# Patient Record
Sex: Female | Born: 1970 | Race: White
Health system: Southern US, Community
[De-identification: ages and names within clinical notes are randomized; demographics above are authoritative.]

---

## 2004-09-09 ENCOUNTER — Other Ambulatory Visit: Admission: RE | Admit: 2004-09-09 | Discharge: 2004-09-09 | Payer: Self-pay | Admitting: Obstetrics and Gynecology

## 2004-12-12 ENCOUNTER — Emergency Department (HOSPITAL_COMMUNITY): Admission: EM | Admit: 2004-12-12 | Discharge: 2004-12-12 | Payer: Self-pay | Admitting: Emergency Medicine

## 2005-03-31 ENCOUNTER — Inpatient Hospital Stay (HOSPITAL_COMMUNITY): Admission: AD | Admit: 2005-03-31 | Discharge: 2005-04-02 | Payer: Self-pay | Admitting: Obstetrics and Gynecology

## 2006-02-12 ENCOUNTER — Other Ambulatory Visit: Admission: RE | Admit: 2006-02-12 | Discharge: 2006-02-12 | Payer: Self-pay | Admitting: Obstetrics and Gynecology

## 2006-10-12 ENCOUNTER — Ambulatory Visit (HOSPITAL_COMMUNITY): Admission: RE | Admit: 2006-10-12 | Discharge: 2006-10-12 | Payer: Self-pay | Admitting: Internal Medicine

## 2007-05-20 ENCOUNTER — Ambulatory Visit (HOSPITAL_COMMUNITY): Admission: RE | Admit: 2007-05-20 | Discharge: 2007-05-20 | Payer: Self-pay | Admitting: Internal Medicine

## 2008-06-01 ENCOUNTER — Ambulatory Visit (HOSPITAL_COMMUNITY): Admission: RE | Admit: 2008-06-01 | Discharge: 2008-06-01 | Payer: Self-pay | Admitting: Internal Medicine

## 2008-06-05 ENCOUNTER — Encounter: Admission: RE | Admit: 2008-06-05 | Discharge: 2008-06-05 | Payer: Self-pay | Admitting: Internal Medicine

## 2010-09-15 ENCOUNTER — Ambulatory Visit (HOSPITAL_COMMUNITY): Admission: RE | Admit: 2010-09-15 | Discharge: 2010-09-15 | Payer: Self-pay | Admitting: Obstetrics and Gynecology

## 2011-04-10 NOTE — H&P (Signed)
Kim Mendoza, Kim Mendoza NO.:  [id number]   MEDICAL RECORD NO.:  [id number]          PATIENT TYPE:  INP   LOCATION:  9165                          FACILITY:  [facility]   PHYSICIAN:  Janine Limbo, M.D.DATE OF BIRTH:  Apr 18, 1971   DATE OF ADMISSION:  03/31/2005  DATE OF DISCHARGE:                                HISTORY & PHYSICAL   HISTORY OF PRESENT ILLNESS:  Kim Mendoza is a 40 year old married white  female, gravida 3, para 2-0-0-2, at 39-1/7 weeks who presents with  contractions every 4 minutes for 2 hours.  Denies leaking, bleeding,  headache, nausea and vomiting, visual disturbances.  Her pregnancy has been  followed by the [facility] certified nurse midwife service and has  been remarkable for:  (1) History of abnormal Pap.  (2) History of  gestational diabetes with the second pregnancy.  (3) History of melanoma.  (4) Ulcerative colitis.  (5) Group B Strep negative.  Her prenatal labs were  collected on September 09, 2004:  Hemoglobin 13.1, hematocrit 38.4, platelets  305,000, blood type A positive, antibody negative, RPR nonreactive, rubella  immune, hepatitis B surface antigen negative, 1-hour Glucola on November 04, 2004, was 129.  Second 1-hour Glucola on December 29, 2004, was 133 with  hemoglobin at that time 11.8.  Culture of the vaginal tract in the third  trimester for Group B Strep was negative.  The rest of her prenatal care was  unremarkable.   HISTORY OF PRESENT PREGNANCY:  She presented for care at [facility] on  September 09, 2004, at 11-2/[redacted] weeks gestation.  Pregnancy ultrasonography at  [redacted] weeks gestation shows growth consistent with previous dates, confirming  [facility] of Apr 06, 2005.  At that point the patient mentioned her plans for cord  blood collection at delivery.  She was evaluated at [redacted] weeks gestation for a  virus.  The rest of her prenatal care has been unremarkable.   OB HISTORY:  She is a gravida 3, para 2-0-0-2.  In June  of 2000 she had a  vaginal delivery of a female infant weighing 6 pounds 2 ounces at [redacted] weeks  gestation after 20 hours in labor.  She had an epidural for anesthesia.  She  had an episiotomy and delivery was vacuum assisted.  Infant was born at  [facility] in [place].  Delivered in OP presentation and infant's  name was Earlene Plater.  In December of 2002, she had a second vaginal delivery of a  female infant weighing 6 pounds 10 ounces at [redacted] weeks gestation after 3  hours in labor.  She had an epidural for anesthesia.  This was at [facility].  Infant's name was Pecola Leisure, delivered in OP position, and the  patient had gestational diabetes with that pregnancy.   ALLERGIES:  SULFA resulting in hives and fever, PHENERGAN resulting in  amnesia.   PAST MEDICAL HISTORY:  She reports having had the usual childhood diseases.  She had an abnormal Pap in 1991.  She has an occasional yeast infection.  During pregnancy, she reports that she is anemic.  She had a kidney stone in  1998.  She had melanoma in 1998 with no recurrence.  Ulcerative colitis  diagnosed in 1994 with no current medications as well as stomach ulcer in  1993.   PAST SURGICAL HISTORY:  Remarkable for melanoma removal in 1998, bleeding  ulcer in 1993, and hospitalization for ulcerative colitis in 1994.   FAMILY HISTORY:  Remarkable for father with varicosities.  Father with  prostate cancer.  Maternal grandfather with testicular cancer.  Paternal  grandfather with prostate cancer and metastasis.  Father with history of  stroke.  Mother with migraines.  Maternal grandfather is an alcoholic.   GENETIC HISTORY:  Negative.   SOCIAL HISTORY:  The patient is married to the father of the baby.  His name  is Air cabin crew.  He is involved and supportive.  They both have 4 years of college  education.  The patient is a full time homemaker.  Father of the baby is  employed as a race Market researcher.  They deny any alcohol, tobacco, or  illicit  drug use with the pregnancy.   OBJECTIVE:  VITAL SIGNS:  Stable, she is afebrile.  HEENT:  Grossly within normal limits.  CHEST:  Clear to auscultation.  HEART:  Regular rate and rhythm.  ABDOMEN:  Gravid in contour with fundal height extending approximately 39 cm  above the pubic symphysis.  Fetal heart rate is positive accelerations,  negative CST, occasional early variables, contractions every 2 to 3 minutes.  PELVIC:  Cervix is 4 cm, 90%, vertex -1 with intact bag of water.  EXTREMITIES:  Normal.   ASSESSMENT:  1.  Intrauterine pregnancy at term.  2.  Early labor.   PLAN:  1.  Admit to birthing suite, Dr. Stefano Gaul has been notified.  2.  Routine C.N.M. orders.  3.  The patient plans epidural.  4.  Anticipate normal spontaneous vaginal delivery.      KS/MEDQ  D:  03/31/2005  T:  03/31/2005  Job:  [id number]

## 2013-08-10 ENCOUNTER — Other Ambulatory Visit: Payer: Self-pay | Admitting: Family Medicine

## 2013-08-10 DIAGNOSIS — Z1231 Encounter for screening mammogram for malignant neoplasm of breast: Secondary | ICD-10-CM

## 2013-08-24 ENCOUNTER — Ambulatory Visit: Payer: Self-pay

## 2013-09-07 ENCOUNTER — Ambulatory Visit: Payer: Self-pay

## 2013-09-21 ENCOUNTER — Ambulatory Visit
Admission: RE | Admit: 2013-09-21 | Discharge: 2013-09-21 | Disposition: A | Discharge status: Home / Self Care | Payer: 59 | Source: Ambulatory Visit | Attending: Family Medicine | Admitting: Family Medicine

## 2013-09-21 DIAGNOSIS — Z1231 Encounter for screening mammogram for malignant neoplasm of breast: Secondary | ICD-10-CM

## 2014-09-25 ENCOUNTER — Other Ambulatory Visit: Payer: Self-pay | Admitting: Family Medicine

## 2014-09-25 DIAGNOSIS — R102 Pelvic and perineal pain: Secondary | ICD-10-CM

## 2014-09-26 ENCOUNTER — Ambulatory Visit
Admission: RE | Admit: 2014-09-26 | Discharge: 2014-09-26 | Disposition: A | Discharge status: Home / Self Care | Payer: 59 | Source: Ambulatory Visit | Attending: Family Medicine | Admitting: Family Medicine

## 2014-09-26 DIAGNOSIS — R102 Pelvic and perineal pain: Secondary | ICD-10-CM

## 2014-09-27 ENCOUNTER — Other Ambulatory Visit: Payer: 59

## 2014-09-28 ENCOUNTER — Other Ambulatory Visit: Payer: 59

## 2014-10-01 ENCOUNTER — Other Ambulatory Visit: Payer: Self-pay | Admitting: Family Medicine

## 2014-10-01 DIAGNOSIS — R9389 Abnormal findings on diagnostic imaging of other specified body structures: Secondary | ICD-10-CM

## 2014-10-01 DIAGNOSIS — M5416 Radiculopathy, lumbar region: Secondary | ICD-10-CM

## 2014-10-01 DIAGNOSIS — M5412 Radiculopathy, cervical region: Secondary | ICD-10-CM

## 2014-10-03 ENCOUNTER — Ambulatory Visit
Admission: RE | Admit: 2014-10-03 | Discharge: 2014-10-03 | Disposition: A | Discharge status: Home / Self Care | Payer: 59 | Source: Ambulatory Visit | Attending: Family Medicine | Admitting: Family Medicine

## 2014-10-03 ENCOUNTER — Other Ambulatory Visit: Payer: Self-pay | Admitting: Family Medicine

## 2014-10-03 DIAGNOSIS — R29898 Other symptoms and signs involving the musculoskeletal system: Secondary | ICD-10-CM

## 2014-10-03 DIAGNOSIS — M5416 Radiculopathy, lumbar region: Secondary | ICD-10-CM

## 2014-10-03 DIAGNOSIS — M2560 Stiffness of unspecified joint, not elsewhere classified: Secondary | ICD-10-CM

## 2014-10-03 DIAGNOSIS — R200 Anesthesia of skin: Secondary | ICD-10-CM

## 2014-10-03 DIAGNOSIS — R202 Paresthesia of skin: Secondary | ICD-10-CM

## 2014-10-03 DIAGNOSIS — M5412 Radiculopathy, cervical region: Secondary | ICD-10-CM

## 2014-10-08 ENCOUNTER — Ambulatory Visit
Admission: RE | Admit: 2014-10-08 | Discharge: 2014-10-08 | Disposition: A | Discharge status: Home / Self Care | Payer: 59 | Source: Ambulatory Visit | Attending: Family Medicine | Admitting: Family Medicine

## 2014-10-08 DIAGNOSIS — R9389 Abnormal findings on diagnostic imaging of other specified body structures: Secondary | ICD-10-CM

## 2014-10-12 ENCOUNTER — Ambulatory Visit
Admission: RE | Admit: 2014-10-12 | Discharge: 2014-10-12 | Disposition: A | Discharge status: Home / Self Care | Payer: 59 | Source: Ambulatory Visit | Attending: Family Medicine | Admitting: Family Medicine

## 2014-10-12 DIAGNOSIS — R200 Anesthesia of skin: Secondary | ICD-10-CM

## 2014-10-12 DIAGNOSIS — R29898 Other symptoms and signs involving the musculoskeletal system: Secondary | ICD-10-CM

## 2014-10-12 DIAGNOSIS — M2560 Stiffness of unspecified joint, not elsewhere classified: Secondary | ICD-10-CM

## 2014-10-12 DIAGNOSIS — R202 Paresthesia of skin: Secondary | ICD-10-CM

## 2014-10-12 MED ORDER — GADOBENATE DIMEGLUMINE 529 MG/ML IV SOLN
10.0000 mL | Freq: Once | INTRAVENOUS | Status: AC | PRN
  Administered 2014-10-12: 10 mL via INTRAVENOUS

## 2015-12-19 ENCOUNTER — Ambulatory Visit (INDEPENDENT_AMBULATORY_CARE_PROVIDER_SITE_OTHER): Payer: 59

## 2015-12-19 ENCOUNTER — Ambulatory Visit (INDEPENDENT_AMBULATORY_CARE_PROVIDER_SITE_OTHER): Payer: 59 | Admitting: Podiatry

## 2015-12-19 ENCOUNTER — Encounter: Payer: Self-pay | Admitting: Podiatry

## 2015-12-19 VITALS — BP 101/66 | HR 55 | Resp 16 | Ht 61.0 in | Wt 120.0 lb

## 2015-12-19 DIAGNOSIS — M21612 Bunion of left foot: Secondary | ICD-10-CM

## 2015-12-19 DIAGNOSIS — M21611 Bunion of right foot: Secondary | ICD-10-CM

## 2015-12-19 NOTE — Progress Notes (Signed)
Subjective:     Patient ID: Kim Mendoza, female   DOB: 1971/11/17, 45 y.o.   MRN: [id number]  HPI patient states I have bunions on both my feet that need to be fixed as they're getting increasingly sore and I like to get this done but I'm not sure if I can do it in the near future or later as I am going skiing in the middle of March and then I'm very busy in the summertime. Patient states she's tried wider shoes she's tried padding around the areas and she's tried soaks without relief   Review of Systems  All other systems reviewed and are negative.      Objective:   Physical Exam  Constitutional: She is oriented to person, place, and time.  Cardiovascular: Intact distal pulses.   Musculoskeletal: Normal range of motion.  Neurological: She is oriented to person, place, and time.  Skin: Skin is warm.  Nursing note and vitals reviewed.  neurovascular status intact muscle strength adequate range of motion within normal limits with patient found to have large hyperostosis medial aspect first metatarsal head bilateral with redness and pain when palpated and deviation of the hallux against second toe with good range of motion first MPJ with no crepitus of the joint. 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 At this point I recommended structural osteotomy first metatarsal bilateral explained procedure to patient and she wants to do them both at the same time and we'll decide between doing them in March or November depending on schedule. Education was given today at great length  X-ray reports bilateral AP lateral views indicated elevation of the 1 to intermetatarsal angle of approximately 16 bilateral with deviation the hallux against the second toe and large hyperostosis medial aspect first metatarsal head bilateral

## 2015-12-19 NOTE — Patient Instructions (Signed)

## 2015-12-19 NOTE — Progress Notes (Signed)
   Subjective:    Patient ID: Kim Mendoza, female    DOB: 1971/08/18, 45 y.o.   MRN: [id number]  HPI  Patient presents with bilateral foot pain; bunions. Pt stated, "wants to discuss how to make feet feel better".   Review of Systems  All other systems reviewed and are negative.      Objective:   Physical Exam        Assessment & Plan:

## 2016-04-07 ENCOUNTER — Other Ambulatory Visit: Payer: Self-pay

## 2016-04-07 DIAGNOSIS — Z1231 Encounter for screening mammogram for malignant neoplasm of breast: Secondary | ICD-10-CM

## 2016-05-01 ENCOUNTER — Ambulatory Visit
Admission: RE | Admit: 2016-05-01 | Discharge: 2016-05-01 | Disposition: A | Discharge status: Home / Self Care | Payer: 59 | Source: Ambulatory Visit

## 2016-05-01 DIAGNOSIS — Z1231 Encounter for screening mammogram for malignant neoplasm of breast: Secondary | ICD-10-CM

## 2017-10-20 ENCOUNTER — Other Ambulatory Visit: Payer: Self-pay | Admitting: Family Medicine

## 2017-10-20 DIAGNOSIS — N6320 Unspecified lump in the left breast, unspecified quadrant: Secondary | ICD-10-CM

## 2017-10-20 DIAGNOSIS — N939 Abnormal uterine and vaginal bleeding, unspecified: Secondary | ICD-10-CM

## 2017-10-25 ENCOUNTER — Ambulatory Visit
Admission: RE | Admit: 2017-10-25 | Discharge: 2017-10-25 | Disposition: A | Discharge status: Home / Self Care | Payer: 59 | Source: Ambulatory Visit | Attending: Family Medicine | Admitting: Family Medicine

## 2017-10-25 DIAGNOSIS — N939 Abnormal uterine and vaginal bleeding, unspecified: Secondary | ICD-10-CM

## 2017-10-26 ENCOUNTER — Ambulatory Visit
Admission: RE | Admit: 2017-10-26 | Discharge: 2017-10-26 | Disposition: A | Discharge status: Home / Self Care | Payer: 59 | Source: Ambulatory Visit | Attending: Family Medicine | Admitting: Family Medicine

## 2017-10-26 ENCOUNTER — Other Ambulatory Visit: Payer: Self-pay | Admitting: Family Medicine

## 2017-10-26 ENCOUNTER — Ambulatory Visit
Admission: RE | Admit: 2017-10-26 | Discharge: 2017-10-26 | Disposition: A | Discharge status: Home / Self Care | Payer: PRIVATE HEALTH INSURANCE | Source: Ambulatory Visit | Attending: Family Medicine | Admitting: Family Medicine

## 2017-10-26 DIAGNOSIS — N6320 Unspecified lump in the left breast, unspecified quadrant: Secondary | ICD-10-CM

## 2017-10-29 ENCOUNTER — Ambulatory Visit
Admission: RE | Admit: 2017-10-29 | Discharge: 2017-10-29 | Disposition: A | Discharge status: Home / Self Care | Payer: PRIVATE HEALTH INSURANCE | Source: Ambulatory Visit | Attending: Family Medicine | Admitting: Family Medicine

## 2017-10-29 ENCOUNTER — Other Ambulatory Visit: Payer: Self-pay | Admitting: Family Medicine

## 2017-10-29 DIAGNOSIS — N6320 Unspecified lump in the left breast, unspecified quadrant: Secondary | ICD-10-CM

## 2018-01-07 DIAGNOSIS — N951 Menopausal and female climacteric states: Secondary | ICD-10-CM | POA: Diagnosis not present

## 2018-01-07 DIAGNOSIS — J45909 Unspecified asthma, uncomplicated: Secondary | ICD-10-CM | POA: Diagnosis not present

## 2018-01-07 DIAGNOSIS — Z6822 Body mass index (BMI) 22.0-22.9, adult: Secondary | ICD-10-CM | POA: Diagnosis not present

## 2018-01-11 DIAGNOSIS — J069 Acute upper respiratory infection, unspecified: Secondary | ICD-10-CM | POA: Diagnosis not present

## 2018-01-11 DIAGNOSIS — J329 Chronic sinusitis, unspecified: Secondary | ICD-10-CM | POA: Diagnosis not present

## 2018-01-11 DIAGNOSIS — R0602 Shortness of breath: Secondary | ICD-10-CM | POA: Diagnosis not present

## 2018-01-14 ENCOUNTER — Encounter: Payer: Self-pay | Admitting: Internal Medicine

## 2018-02-24 ENCOUNTER — Ambulatory Visit: Payer: PRIVATE HEALTH INSURANCE | Admitting: Internal Medicine

## 2018-05-18 DIAGNOSIS — Z23 Encounter for immunization: Secondary | ICD-10-CM | POA: Diagnosis not present

## 2018-05-18 DIAGNOSIS — T753XXA Motion sickness, initial encounter: Secondary | ICD-10-CM | POA: Diagnosis not present

## 2018-05-18 DIAGNOSIS — Z6822 Body mass index (BMI) 22.0-22.9, adult: Secondary | ICD-10-CM | POA: Diagnosis not present

## 2018-05-23 ENCOUNTER — Other Ambulatory Visit: Payer: Self-pay | Admitting: Family Medicine

## 2018-05-23 DIAGNOSIS — N6012 Diffuse cystic mastopathy of left breast: Secondary | ICD-10-CM

## 2018-06-02 ENCOUNTER — Ambulatory Visit
Admission: RE | Admit: 2018-06-02 | Discharge: 2018-06-02 | Disposition: A | Discharge status: Home / Self Care | Payer: Commercial Managed Care - PPO | Source: Ambulatory Visit | Attending: Family Medicine | Admitting: Family Medicine

## 2018-06-02 DIAGNOSIS — R922 Inconclusive mammogram: Secondary | ICD-10-CM | POA: Diagnosis not present

## 2018-06-02 DIAGNOSIS — N6012 Diffuse cystic mastopathy of left breast: Secondary | ICD-10-CM

## 2018-06-16 DIAGNOSIS — T753XXA Motion sickness, initial encounter: Secondary | ICD-10-CM | POA: Diagnosis not present

## 2018-06-16 DIAGNOSIS — J309 Allergic rhinitis, unspecified: Secondary | ICD-10-CM | POA: Diagnosis not present

## 2018-06-16 DIAGNOSIS — H9312 Tinnitus, left ear: Secondary | ICD-10-CM | POA: Diagnosis not present

## 2018-08-30 DIAGNOSIS — Z23 Encounter for immunization: Secondary | ICD-10-CM | POA: Diagnosis not present

## 2018-08-30 DIAGNOSIS — D485 Neoplasm of uncertain behavior of skin: Secondary | ICD-10-CM | POA: Diagnosis not present

## 2018-08-30 DIAGNOSIS — L570 Actinic keratosis: Secondary | ICD-10-CM | POA: Diagnosis not present

## 2018-12-05 ENCOUNTER — Other Ambulatory Visit: Payer: Self-pay | Admitting: Family Medicine

## 2018-12-05 DIAGNOSIS — Z1231 Encounter for screening mammogram for malignant neoplasm of breast: Secondary | ICD-10-CM

## 2018-12-06 ENCOUNTER — Ambulatory Visit
Admission: RE | Admit: 2018-12-06 | Discharge: 2018-12-06 | Disposition: A | Discharge status: Home / Self Care | Payer: Commercial Managed Care - PPO | Source: Ambulatory Visit | Attending: Family Medicine | Admitting: Family Medicine

## 2018-12-06 DIAGNOSIS — Z1231 Encounter for screening mammogram for malignant neoplasm of breast: Secondary | ICD-10-CM | POA: Diagnosis not present

## 2018-12-19 DIAGNOSIS — C44319 Basal cell carcinoma of skin of other parts of face: Secondary | ICD-10-CM | POA: Diagnosis not present

## 2019-12-12 ENCOUNTER — Other Ambulatory Visit: Payer: Self-pay | Admitting: Family Medicine

## 2019-12-12 DIAGNOSIS — Z1231 Encounter for screening mammogram for malignant neoplasm of breast: Secondary | ICD-10-CM

## 2019-12-25 ENCOUNTER — Ambulatory Visit
Admission: RE | Admit: 2019-12-25 | Discharge: 2019-12-25 | Disposition: A | Discharge status: Home / Self Care | Payer: Commercial Managed Care - PPO | Source: Ambulatory Visit | Attending: Family Medicine | Admitting: Family Medicine

## 2019-12-25 ENCOUNTER — Other Ambulatory Visit: Payer: Self-pay

## 2019-12-25 DIAGNOSIS — Z1231 Encounter for screening mammogram for malignant neoplasm of breast: Secondary | ICD-10-CM

## 2019-12-27 ENCOUNTER — Other Ambulatory Visit: Payer: Self-pay | Admitting: Family Medicine

## 2019-12-27 DIAGNOSIS — R928 Other abnormal and inconclusive findings on diagnostic imaging of breast: Secondary | ICD-10-CM

## 2020-01-01 ENCOUNTER — Other Ambulatory Visit: Payer: Self-pay

## 2020-01-01 ENCOUNTER — Ambulatory Visit
Admission: RE | Admit: 2020-01-01 | Discharge: 2020-01-01 | Disposition: A | Discharge status: Home / Self Care | Payer: Commercial Managed Care - PPO | Source: Ambulatory Visit | Attending: Family Medicine | Admitting: Family Medicine

## 2020-01-01 DIAGNOSIS — R928 Other abnormal and inconclusive findings on diagnostic imaging of breast: Secondary | ICD-10-CM

## 2020-02-07 ENCOUNTER — Ambulatory Visit: Payer: Commercial Managed Care - PPO | Attending: Internal Medicine

## 2020-02-07 DIAGNOSIS — Z20822 Contact with and (suspected) exposure to covid-19: Secondary | ICD-10-CM

## 2020-02-08 LAB — NOVEL CORONAVIRUS, NAA: SARS-CoV-2, NAA: NOT DETECTED

## 2020-03-21 ENCOUNTER — Other Ambulatory Visit: Payer: Self-pay | Admitting: Family Medicine

## 2020-03-21 DIAGNOSIS — N926 Irregular menstruation, unspecified: Secondary | ICD-10-CM

## 2020-03-28 ENCOUNTER — Other Ambulatory Visit: Payer: Commercial Managed Care - PPO

## 2020-04-02 ENCOUNTER — Ambulatory Visit
Admission: RE | Admit: 2020-04-02 | Discharge: 2020-04-02 | Disposition: A | Discharge status: Home / Self Care | Payer: Commercial Managed Care - PPO | Source: Ambulatory Visit | Attending: Family Medicine | Admitting: Family Medicine

## 2020-04-02 DIAGNOSIS — N926 Irregular menstruation, unspecified: Secondary | ICD-10-CM

## 2021-09-01 IMAGING — MG DIGITAL SCREENING BILAT W/ TOMO W/ CAD
8 series · 9 of 24 positions shown · non-contrast
Comparison: Previous exam(s).

CLINICAL DATA: Screening.

EXAM:
DIGITAL SCREENING BILATERAL MAMMOGRAM WITH TOMO AND CAD

[R CC synth-2D]
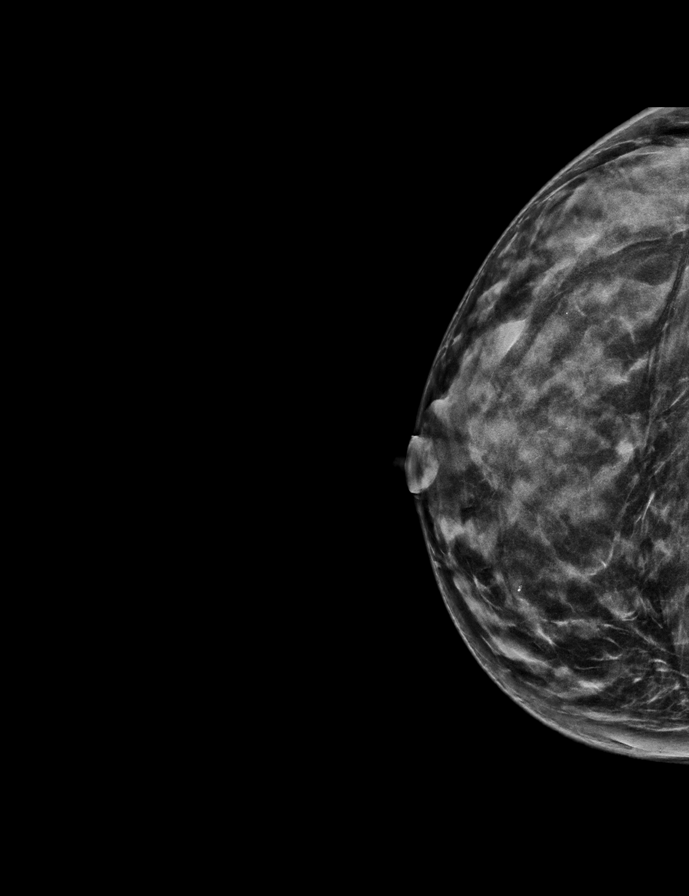

[L MLO synth-2D]
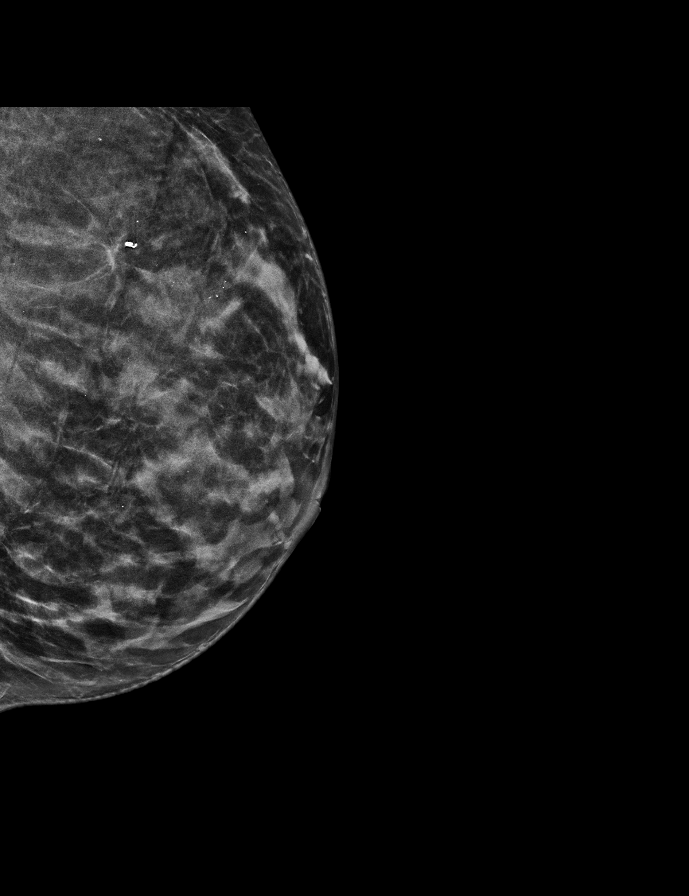

[R MLO synth-2D]
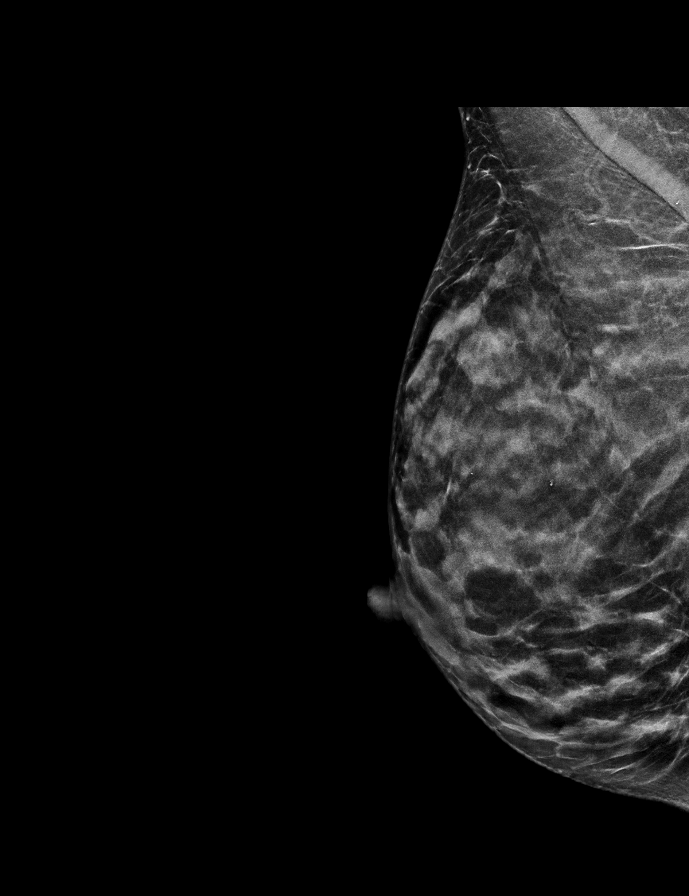

[L CC synth-2D]
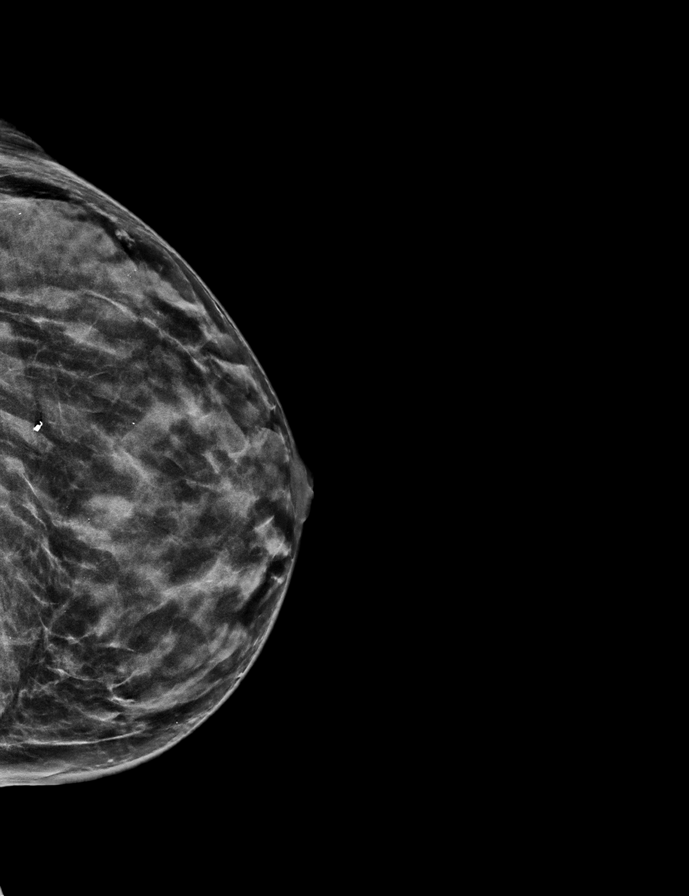

[L CC tomo · 2 of 50 frames shown]
[frame 17/50]
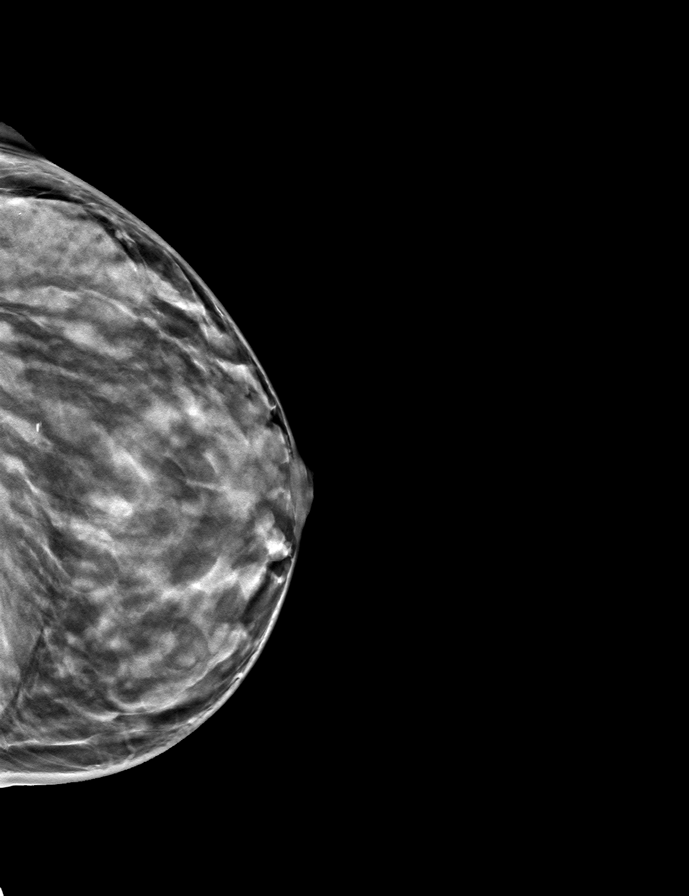
[frame 25/50]
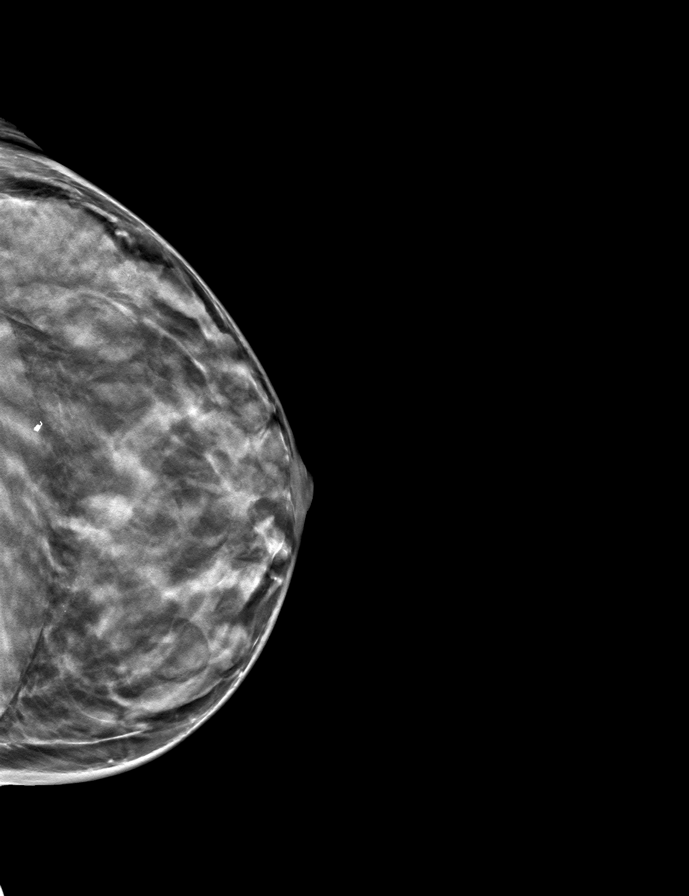

[L MLO tomo · tomo slice 25/48.0]
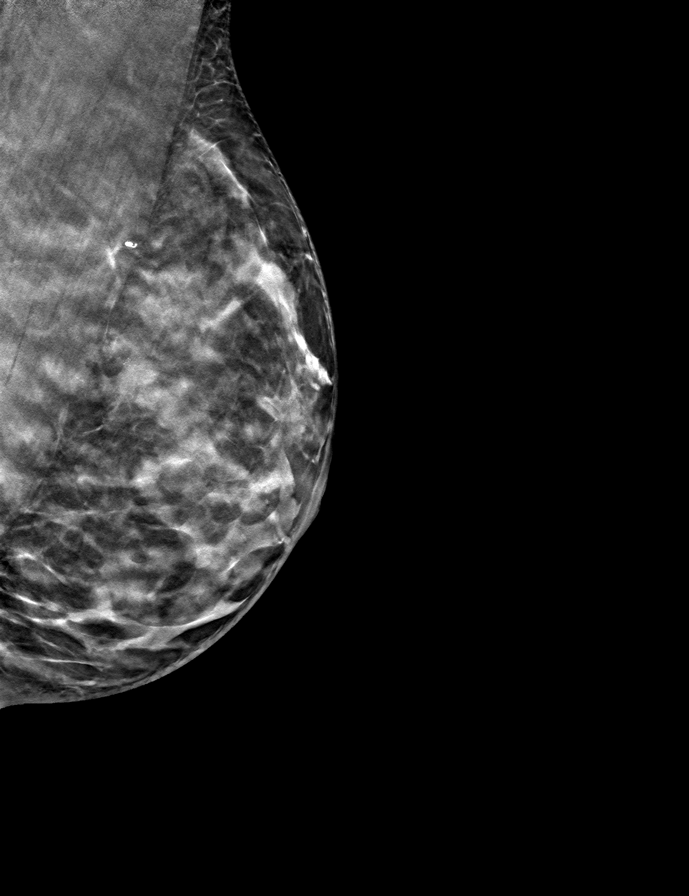

[R CC tomo · tomo slice 27/52.0]
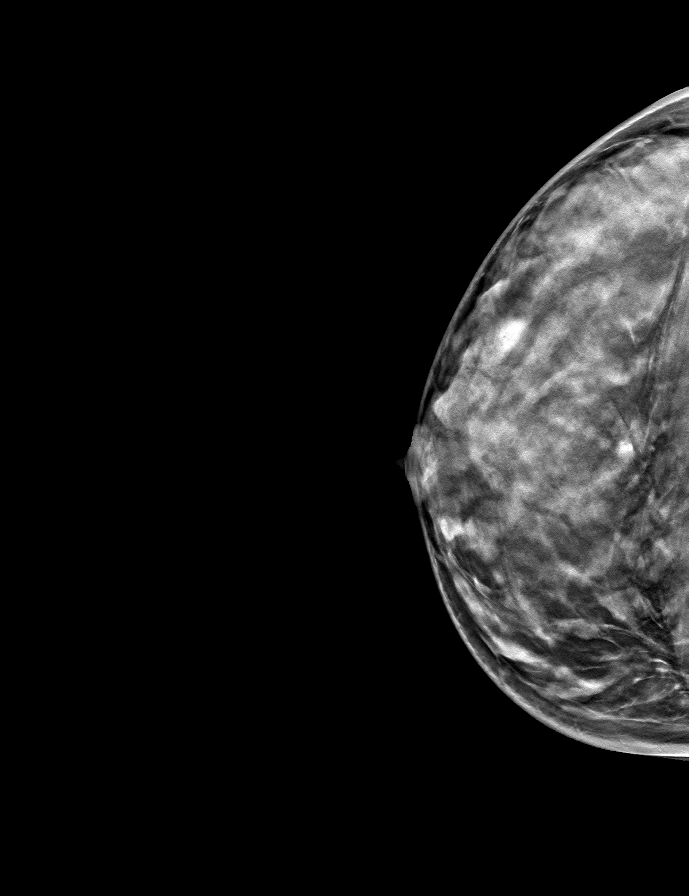

[R MLO tomo · tomo slice 25/49.0]
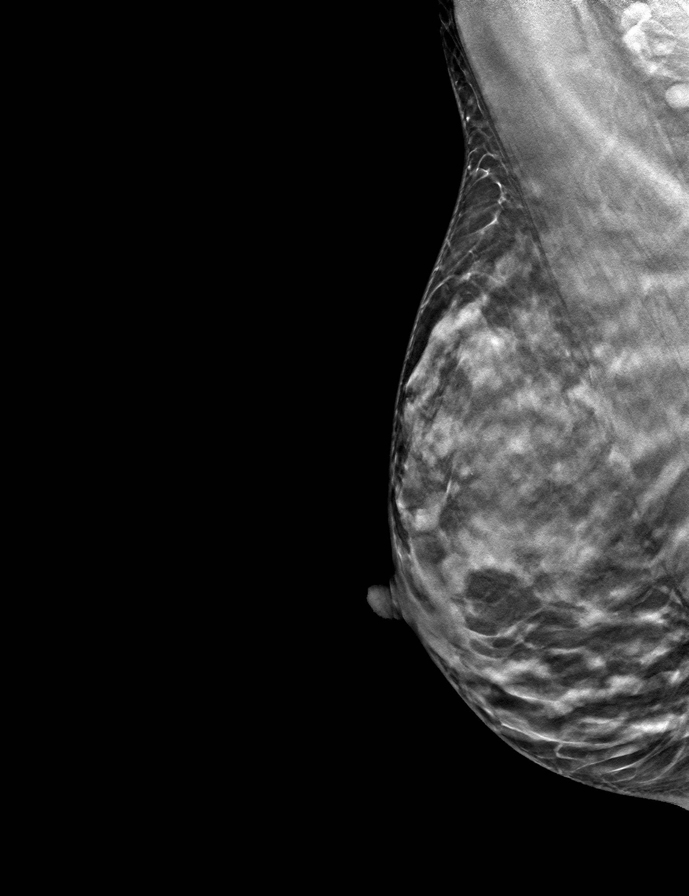

[9 of 24 positions shown; findings below may reference images not displayed]

ACR Breast Density Category d: The breast tissue is extremely dense,
which lowers the sensitivity of mammography.
FINDINGS: In the left breast, calcifications warrant further evaluation. In
the right breast, no findings suspicious for malignancy. Images were
processed with CAD.
IMPRESSION: Further evaluation is suggested for calcifications in the left
breast.

RECOMMENDATION:
Diagnostic mammogram of the left breast. (Code:[id number])

The patient will be contacted regarding the findings, and additional
imaging will be scheduled.

BI-RADS CATEGORY  0: Incomplete. Need additional imaging evaluation
and/or prior mammograms for comparison.

## 2021-10-24 ENCOUNTER — Telehealth (INDEPENDENT_AMBULATORY_CARE_PROVIDER_SITE_OTHER): Payer: Self-pay

## 2021-10-24 ENCOUNTER — Encounter (INDEPENDENT_AMBULATORY_CARE_PROVIDER_SITE_OTHER): Payer: Self-pay | Admitting: Surgical Critical Care

## 2021-10-24 NOTE — Telephone Encounter (Signed)
I spoke with pt about scheduling New Pt appt with Dr. Gwen Her and she said she will call me back on 10/27/21 to schedule.

## 2021-11-20 ENCOUNTER — Encounter (INDEPENDENT_AMBULATORY_CARE_PROVIDER_SITE_OTHER): Payer: Self-pay

## 2021-11-20 NOTE — Progress Notes (Signed)
THIS FORM IS FOR INTERNAL USE FOR VASCULAR SURGERY AND IS NOT A PROGRESS NOTE      Room:         Appt Date/Time: 1/5,1100    50 y.o. female   PCP:   Referring Physician:       Appointment Type: New Patient  Imaging: Outside Imaging, MRI Lumbar spine- 03/13/21, bringing CD  Chief Complaint/HPI: Consult Mini ALIF L4-5 w/Dr. Alois Cliche on 12/10/21 @ [facility]  Date of Last Visit: None    Allergies: Not on File      Selected Medications:   None    Focused Prior Medical History:  None             Smoker           Former Smoker          Never Smoker      Vital Signs this Visit Pulses  HT  BP R BP L Temp   Rad Ulnar Brach Fem Pop DP PT        R          WT  Pulse R Pulse L SpO2       L          Imaging results:        Plan of Care:

## 2021-11-25 ENCOUNTER — Telehealth (HOSPITAL_BASED_OUTPATIENT_CLINIC_OR_DEPARTMENT_OTHER): Payer: Self-pay

## 2021-11-25 NOTE — Telephone Encounter (Signed)
Spoke with patient regarding imaging CD for appointment on 11/27/21. She stated " that I have tried for several weeks to get in contact with [facility]. I spoke with them this morning and sent over another fax request, I am crossing my fingers."

## 2021-11-27 ENCOUNTER — Encounter (INDEPENDENT_AMBULATORY_CARE_PROVIDER_SITE_OTHER): Payer: Self-pay | Admitting: Surgical Critical Care

## 2021-11-27 ENCOUNTER — Ambulatory Visit (INDEPENDENT_AMBULATORY_CARE_PROVIDER_SITE_OTHER): Payer: 59 | Admitting: Surgical Critical Care

## 2021-11-27 VITALS — BP 114/77 | HR 66 | Temp 98.6°F | Ht 61.5 in | Wt 122.8 lb

## 2021-11-27 DIAGNOSIS — M48061 Spinal stenosis, lumbar region without neurogenic claudication: Secondary | ICD-10-CM

## 2021-11-27 DIAGNOSIS — M5136 Other intervertebral disc degeneration, lumbar region: Secondary | ICD-10-CM

## 2021-11-27 NOTE — Progress Notes (Signed)
[facility]    Chief Complaint   Patient presents with    Consult (Initial)     Consult mini ALIF L4-5 w/Dr. Neomia Dear on 12/10/21         History of Present Illness     Theresa Dixon is a 51 y.o. female who presents with back pain and leg pain. He/She was evaluated by Dr. Alois Cliche who plans a lumbar interbody fusion. I was asked to provide the retroperitoneal approach to the lumbar spine.    Past Medical History     Past Medical History:   Diagnosis Date    Skin cancer     Ulcerative colitis    No abdominal surgery    Allergies     Allergies   Allergen Reactions    Penicillins     Promethazine Hives    Sulfa Antibiotics Hives       Medications     No current outpatient medications on file prior to visit.     No current facility-administered medications on file prior to visit.       Review of Systems   Abnormal EKG findings - pending stress test  No DVT or leg swelling  Constitutional: Negative for fevers and chills  Skin: No rash or lesions  Respiratory: Negative for cough, wheezing, or hemoptysis  Cardiovascular: Negative for chest pain and dyspnea  Gastrointestinal: Negative for abdominal pain, nausea, vomiting and diarrhea  Musculoskeletal: Chronic back pain.   Genitourinary: Negative for dysuria  All other systems were reviewed and are negative except what is stated in the HPI    Physical Exam     Vitals:    11/27/21 1053   BP: 114/77   Pulse: 66   Temp:    SpO2:        Body mass index is 22.83 kg/m.    General:  Patient appears their stated age, well-nourished.  Alert and in no apparent distress.  Lungs: Respiratory effort unlabored, chest expansion symmetric.  Cardiac: RRR, no carotid bruits, no JVD.   Abd: Soft, nondistended, nontender.  No guarding or rebound, No mid line pulsatile mass   Extremities:Full ROM, symmetric, warm. No lower extremity edema.  Pulses: Right: Femoral present Popliteal present, DP present, PT present  Pulses: Left: Femoral present, Popliteal present, DP present, PT  present  Neuro: Good insight and judgment, oriented to person, place, and time, gross motor and sensory intact     Imaging     MRI from 03/13/2021 personally reviewed. There is favorable vascular anatomy at the L4/5 level. The report suggests multilevel degenerative disc disease with spinal and foraminal stenosis    Assessment and Plan       Assessment:  51 y.o. female with back pain and leg pain secondary to degenerative disc disease and spinal stenosis    Plan:  Patient is a good candidate for ALIF L4/5.    The rationale for and risks of my participation were discussed with the patient.    I hope this plan of care meets with your approval. Thank you for the opportunity to care for your patient.     Sincerely,     Catha Brow, M.D.    [facility]  [address]   [address]  [place], [place] [address]  T [phone]

## 2021-12-09 IMAGING — US US PELVIS COMPLETE WITH TRANSVAGINAL
1 series · 13 of 25 positions shown · non-contrast
Comparison: 10/25/2017

CLINICAL DATA: Irregular bleeding, pain in pelvis and low back,
painful intercourse with bleeding

EXAM:
TRANSABDOMINAL AND TRANSVAGINAL ULTRASOUND OF PELVIS
TECHNIQUE: Both transabdominal and transvaginal ultrasound examinations of the
pelvis were performed. Transabdominal technique was performed for
global imaging of the pelvis including uterus, ovaries, adnexal
regions, and pelvic cul-de-sac. It was necessary to proceed with
endovaginal exam following the transabdominal exam to visualize the
endometrium and ovaries.

[Series 1: us pelvis complete with transvaginal · 0.22mm/px · 13 of 100 slices shown]
[im 1/100]
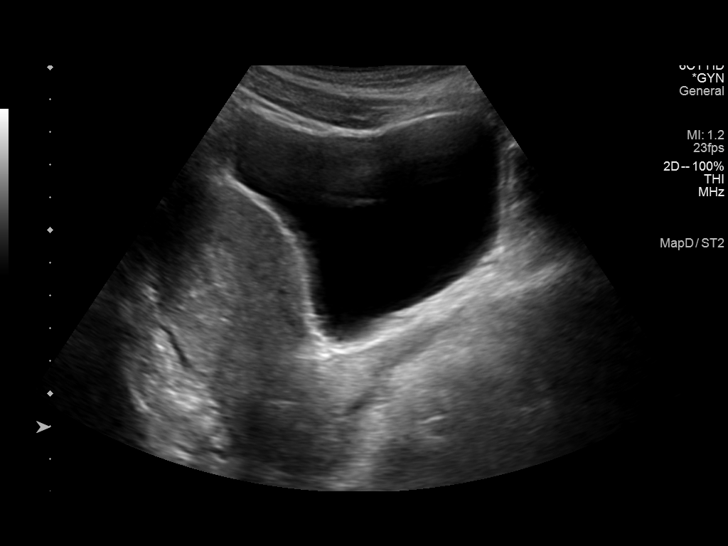
[im 9/100]
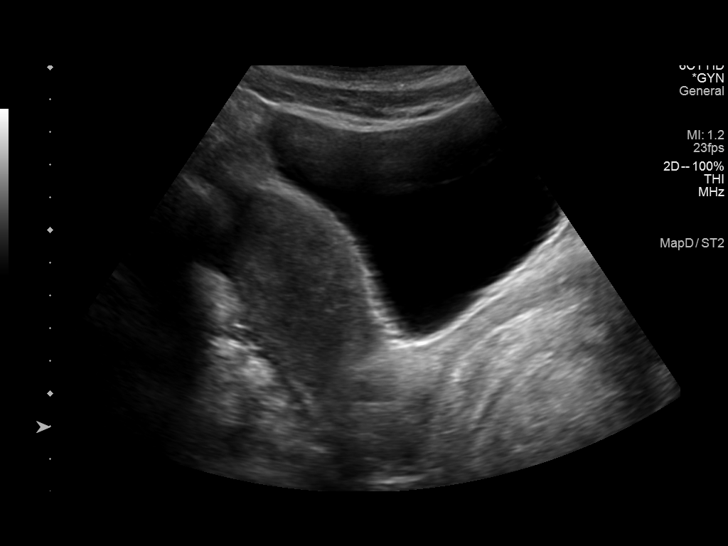
[im 17/100]
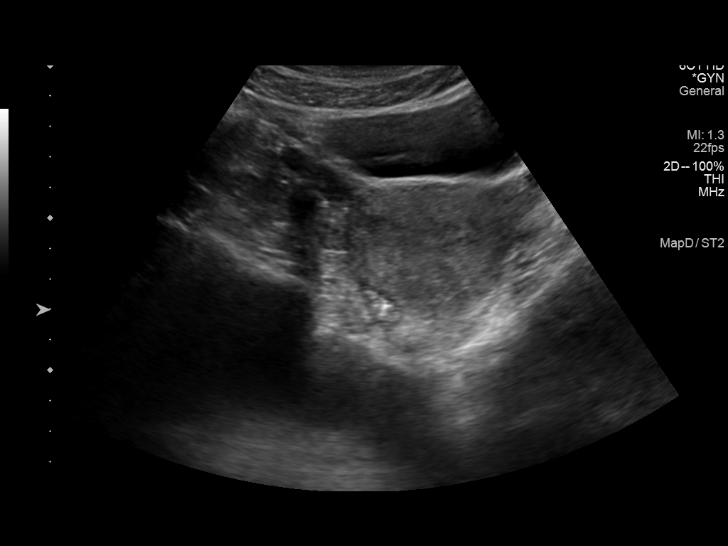
[im 25/100]
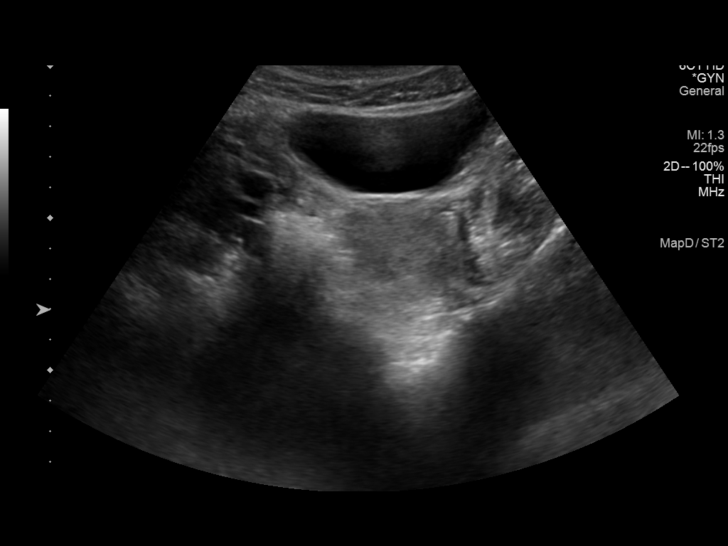
[im 34/100]
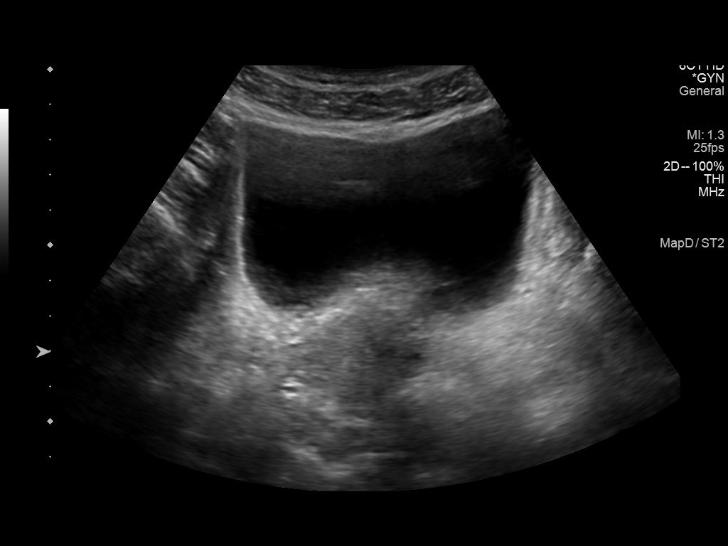
[im 42/100]
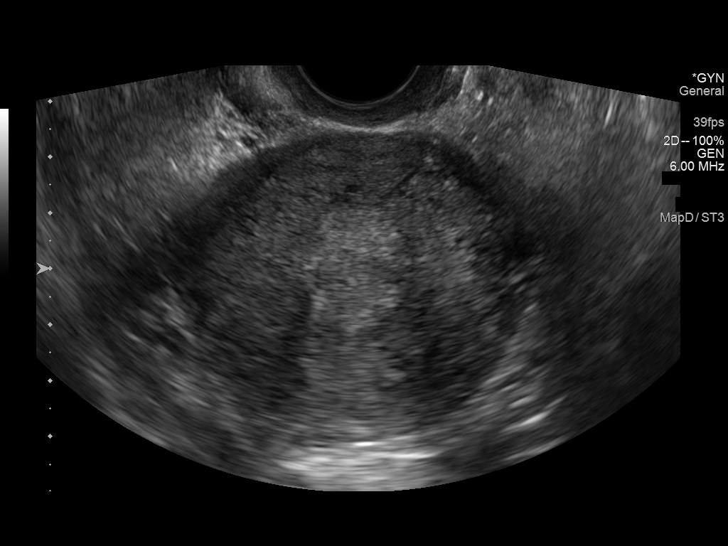
[im 50/100]
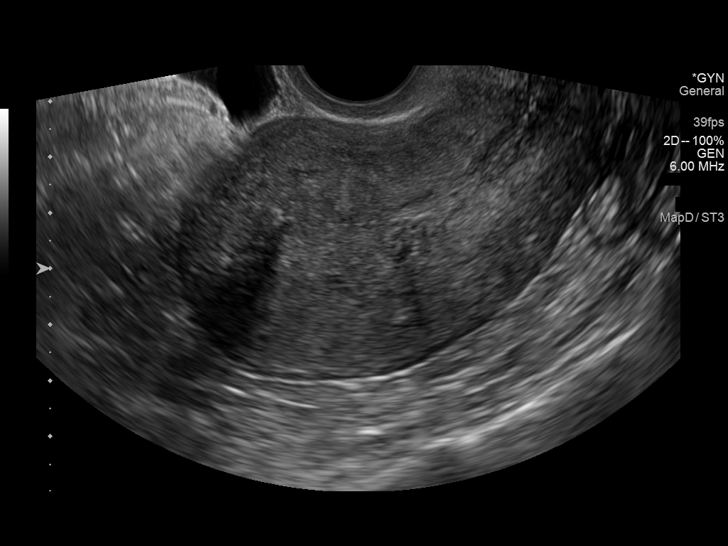
[im 58/100]
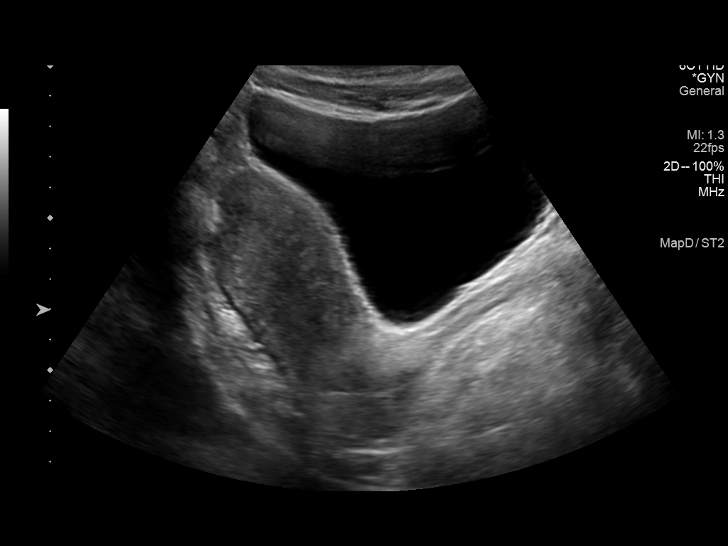
[im 67/100]
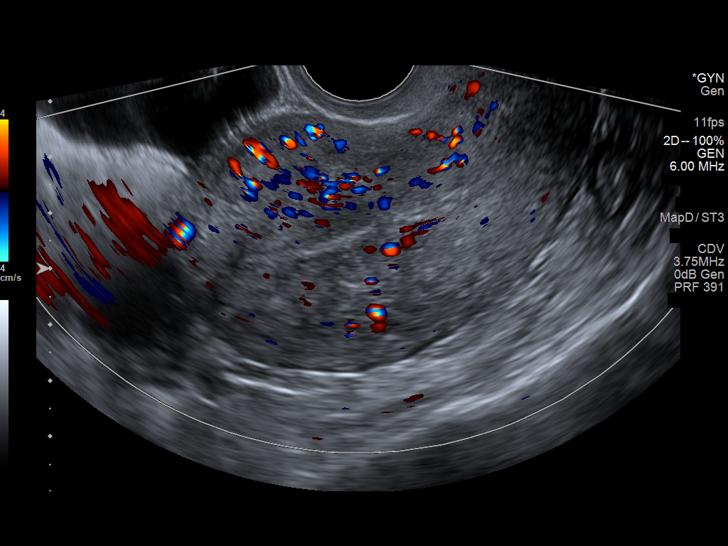
[im 75/100]
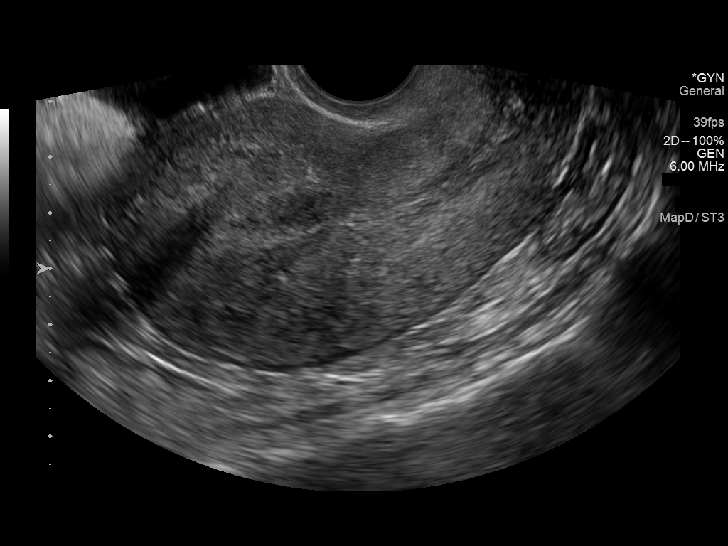
[im 83/100]
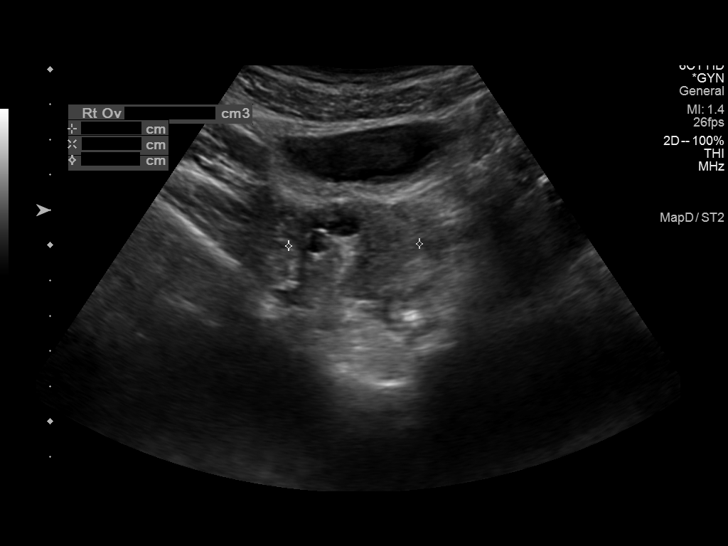
[im 91/100]
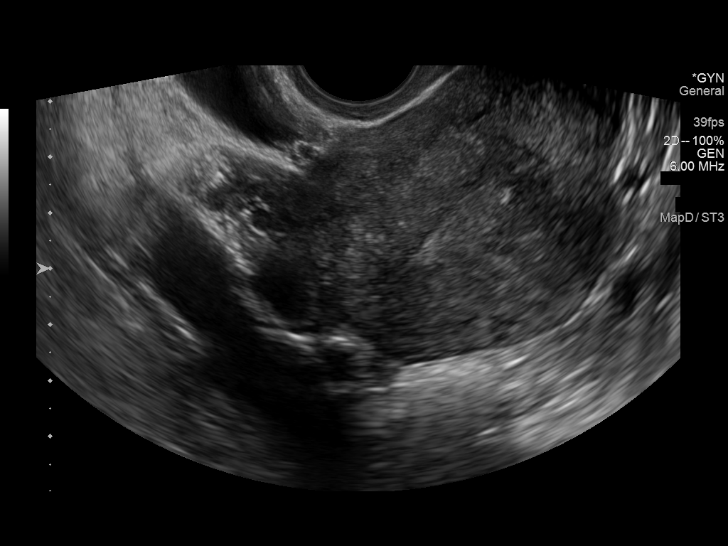
[im 100/100]
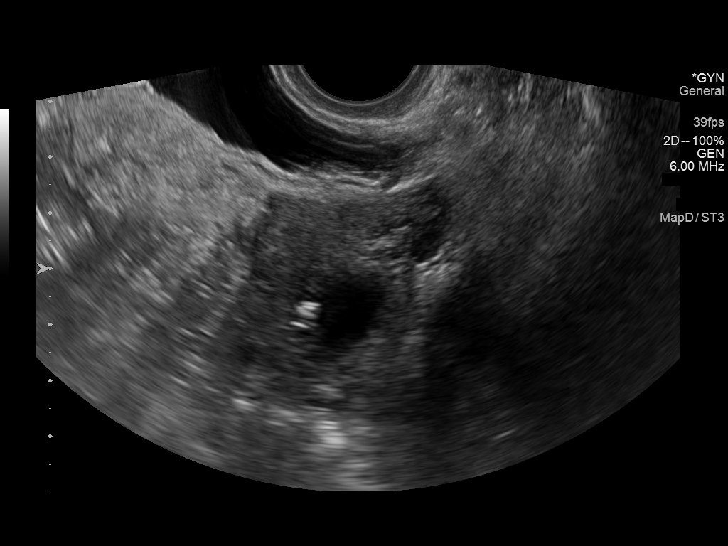

[13 of 25 positions shown; findings below may reference images not displayed]

FINDINGS: Uterus

Measurements: 10.2 x 5.6 x 6.3 cm = volume: 190 mL. Anteverted.
Heterogeneous myometrial echogenicity. Probable small submucosal
leiomyoma 9 x 8 x 8 mm. No definite additional masses.

Endometrium

Thickness: 11 mm.  No endometrial fluid or focal abnormality

Right ovary

Measurements: 4.2 x 3.1 x 3.7 cm = volume: 25.1 mL. Dominant
follicle without mass

Left ovary

Measurements: 3.1 x 2.3 x 3.2 cm = volume: 12.0 mL. Dominant
exophytic follicle versus adjacent paraovarian cyst 2.5 x 1.8 x
cm without additional mass

Other findings

No free pelvic fluid or other adnexal masses.
IMPRESSION: Probable 9 mm submucosal leiomyoma at mid uterus.

Dominant follicle versus 2.5 cm paraovarian cyst adjacent to LEFT
ovary.

Remainder of exam unremarkable.

## 2021-12-10 ENCOUNTER — Encounter (INDEPENDENT_AMBULATORY_CARE_PROVIDER_SITE_OTHER): Payer: 59 | Admitting: Surgical Critical Care

## 2021-12-10 ENCOUNTER — Ambulatory Visit (HOSPITAL_BASED_OUTPATIENT_CLINIC_OR_DEPARTMENT_OTHER): Payer: 59 | Admitting: Surgical Critical Care

## 2021-12-10 DIAGNOSIS — M5136 Other intervertebral disc degeneration, lumbar region: Secondary | ICD-10-CM

## 2021-12-10 DIAGNOSIS — M48061 Spinal stenosis, lumbar region without neurogenic claudication: Secondary | ICD-10-CM

## 2021-12-15 ENCOUNTER — Encounter (INDEPENDENT_AMBULATORY_CARE_PROVIDER_SITE_OTHER): Payer: Self-pay

## 2021-12-15 ENCOUNTER — Encounter (INDEPENDENT_AMBULATORY_CARE_PROVIDER_SITE_OTHER): Payer: Self-pay | Admitting: Surgical Critical Care
